# Patient Record
Sex: Male | Born: 1975 | Race: Black or African American | Hispanic: No | Marital: Single | Smoking: Current some day smoker
Health system: Southern US, Community
[De-identification: ages and names within clinical notes are randomized; demographics above are authoritative.]

## PROBLEM LIST (undated history)

## (undated) DIAGNOSIS — W3400XA Accidental discharge from unspecified firearms or gun, initial encounter: Secondary | ICD-10-CM

## (undated) HISTORY — PX: OTHER SURGICAL HISTORY: SHX169

---

## 2015-05-01 ENCOUNTER — Emergency Department: Payer: No Typology Code available for payment source

## 2015-05-01 ENCOUNTER — Encounter: Payer: Self-pay | Admitting: *Deleted

## 2015-05-01 ENCOUNTER — Emergency Department
Admission: EM | Admit: 2015-05-01 | Discharge: 2015-05-01 | Disposition: A | Discharge status: Home / Self Care | Payer: No Typology Code available for payment source | Attending: Emergency Medicine | Admitting: Emergency Medicine

## 2015-05-01 DIAGNOSIS — Y9389 Activity, other specified: Secondary | ICD-10-CM | POA: Diagnosis not present

## 2015-05-01 DIAGNOSIS — Y92410 Unspecified street and highway as the place of occurrence of the external cause: Secondary | ICD-10-CM | POA: Insufficient documentation

## 2015-05-01 DIAGNOSIS — S199XXA Unspecified injury of neck, initial encounter: Secondary | ICD-10-CM | POA: Diagnosis not present

## 2015-05-01 DIAGNOSIS — S42022A Displaced fracture of shaft of left clavicle, initial encounter for closed fracture: Secondary | ICD-10-CM | POA: Diagnosis not present

## 2015-05-01 DIAGNOSIS — S29002A Unspecified injury of muscle and tendon of back wall of thorax, initial encounter: Secondary | ICD-10-CM | POA: Diagnosis not present

## 2015-05-01 DIAGNOSIS — S4992XA Unspecified injury of left shoulder and upper arm, initial encounter: Secondary | ICD-10-CM | POA: Diagnosis present

## 2015-05-01 DIAGNOSIS — F10129 Alcohol abuse with intoxication, unspecified: Secondary | ICD-10-CM | POA: Insufficient documentation

## 2015-05-01 DIAGNOSIS — Y998 Other external cause status: Secondary | ICD-10-CM | POA: Insufficient documentation

## 2015-05-01 DIAGNOSIS — S2232XA Fracture of one rib, left side, initial encounter for closed fracture: Secondary | ICD-10-CM | POA: Insufficient documentation

## 2015-05-01 DIAGNOSIS — S42002A Fracture of unspecified part of left clavicle, initial encounter for closed fracture: Secondary | ICD-10-CM

## 2015-05-01 MED ORDER — OXYCODONE-ACETAMINOPHEN 5-325 MG PO TABS: 1.0000 | ORAL_TABLET | Freq: Four times a day (QID) | ORAL | Status: AC | PRN
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
## 2015-05-01 NOTE — ED Notes (Signed)
Pt discharged to home.  States friend is on way to pick up.  Discharge instructions given to pt.  Voiced understanding.  No questions or concerns at this time. Items with pt upon discharge.  No items left in ED.  Pt in NAD.  L arm sling in place.
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
## 2015-05-09 ENCOUNTER — Emergency Department
Admission: EM | Admit: 2015-05-09 | Discharge: 2015-05-09 | Disposition: A | Discharge status: Home / Self Care | Payer: No Typology Code available for payment source | Attending: Emergency Medicine | Admitting: Emergency Medicine

## 2015-05-09 ENCOUNTER — Emergency Department: Payer: No Typology Code available for payment source

## 2015-05-09 DIAGNOSIS — M25512 Pain in left shoulder: Secondary | ICD-10-CM | POA: Diagnosis present

## 2015-05-09 DIAGNOSIS — S42002A Fracture of unspecified part of left clavicle, initial encounter for closed fracture: Secondary | ICD-10-CM

## 2015-05-09 DIAGNOSIS — S42022D Displaced fracture of shaft of left clavicle, subsequent encounter for fracture with routine healing: Secondary | ICD-10-CM | POA: Diagnosis not present

## 2015-05-09 DIAGNOSIS — S29001D Unspecified injury of muscle and tendon of front wall of thorax, subsequent encounter: Secondary | ICD-10-CM | POA: Diagnosis not present

## 2015-05-09 DIAGNOSIS — F17210 Nicotine dependence, cigarettes, uncomplicated: Secondary | ICD-10-CM | POA: Insufficient documentation

## 2015-05-09 HISTORY — DX: Accidental discharge from unspecified firearms or gun, initial encounter: W34.00XA

## 2015-05-09 MED ORDER — IBUPROFEN 800 MG PO TABS
800.0000 mg | ORAL_TABLET | Freq: Once | ORAL | Status: AC
  Administered 2015-05-09: 800 mg via ORAL
  Filled 2015-05-09: qty 1

## 2015-05-09 MED ORDER — DIAZEPAM 5 MG PO TABS: 5.0000 mg | ORAL_TABLET | Freq: Three times a day (TID) | ORAL | Status: AC | PRN

## 2015-05-09 MED ORDER — DIAZEPAM 5 MG PO TABS
5.0000 mg | ORAL_TABLET | Freq: Once | ORAL | Status: AC
  Administered 2015-05-09: 5 mg via ORAL
  Filled 2015-05-09: qty 1

## 2015-05-09 MED ORDER — OXYCODONE-ACETAMINOPHEN 5-325 MG PO TABS
2.0000 | ORAL_TABLET | Freq: Four times a day (QID) | ORAL | Status: AC | PRN
Start: 2015-05-09 — End: ?
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
## 2015-05-09 NOTE — Discharge Instructions (Signed)
Clavicle Fracture  The clavicle, also called the collarbone, is the long bone that connects your shoulder to your rib cage. You can feel your collarbone at the top of your shoulders and rib cage. A clavicle fracture is a broken clavicle. It is a common injury that can happen at any age.   CAUSES  Common causes of a clavicle fracture include:  · A direct blow to your shoulder.  · A car accident.  · A fall, especially if you try to break your fall with an outstretched arm.  RISK FACTORS  You may be at increased risk if:  · You are younger than 25 years or older than 75 years. Most clavicle fractures happen to people who are younger than 25 years.  · You are a male.  · You play contact sports.  SIGNS AND SYMPTOMS  A fractured clavicle is painful. It also makes it hard to move your arm. Other signs and symptoms may include:  · A shoulder that drops downward and forward.  · Pain when trying to lift your shoulder.  · Bruising, swelling, and tenderness over your clavicle.  · A grinding noise when you try to move your shoulder.  · A bump over your clavicle.  DIAGNOSIS  Your health care provider can usually diagnose a clavicle fracture by asking about your injury and examining your shoulder and clavicle. He or she may take an X-ray to determine the position of your clavicle.  TREATMENT  Treatment depends on the position of your clavicle after the fracture:  · If the broken ends of the bone are not out of place, your health care provider may put your arm in a sling or wrap a support bandage around your chest (figure-of-eight wrap).  · If the broken ends of the bone are out of place, you may need surgery. Surgery may involve placing screws, pins, or plates to keep your clavicle stable while it heals. Healing may take about 3 months.  When your health care provider thinks your fracture has healed enough, you may have to do physical therapy to regain normal movement and build up your arm strength.  HOME CARE INSTRUCTIONS    · Apply ice to the injured area:    Put ice in a plastic bag.    Place a towel between your skin and the bag.    Leave the ice on for 20 minutes, 2-3 times a day.  · If you have a wrap or splint:    Wear it all the time, and remove it only to take a bath or shower.    When you bathe or shower, keep your shoulder in the same position as when the sling or wrap is on.    Do not lift your arm.  · If you have a figure-of-eight wrap:    Another person must tighten it every day.    It should be tight enough to hold your shoulders back.    Allow enough room to place your index finger between your body and the strap.    Loosen the wrap immediately if you feel numbness or tingling in your hands.  · Only take medicines as directed by your health care provider.  · Avoid activities that make the injury or pain worse for 4-6 weeks after surgery.  · Keep all follow-up appointments.  SEEK MEDICAL CARE IF:   Your medicine is not helping to relieve pain and swelling.  SEEK IMMEDIATE MEDICAL CARE IF:   Your arm is   numb, cold, or pale, even when the splint is loose.  MAKE SURE YOU:   · Understand these instructions.  · Will watch your condition.  · Will get help right away if you are not doing well or get worse.     This information is not intended to replace advice given to you by your health care provider. Make sure you discuss any questions you have with your health care provider.     Document Released: 02/19/2005 Document Revised: 05/17/2013 Document Reviewed: 04/04/2013  Elsevier Interactive Patient Education ©2016 Elsevier Inc.
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
